# Patient Record
Sex: Female | Born: 2000 | Race: White | Hispanic: No | Marital: Single | Smoking: Current every day smoker
Health system: Southern US, Community
[De-identification: ages and names within clinical notes are randomized; demographics above are authoritative.]

## PROBLEM LIST (undated history)

## (undated) DIAGNOSIS — F909 Attention-deficit hyperactivity disorder, unspecified type: Secondary | ICD-10-CM

---
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
